# Patient Record
Sex: Male | Born: 1980 | Race: White | Hispanic: No
Health system: Midwestern US, Community
[De-identification: ages and names within clinical notes are randomized; demographics above are authoritative.]

---

## 2007-12-13 ENCOUNTER — Emergency Department: Payer: Self-pay | Admitting: Emergency Medicine

## 2008-06-17 ENCOUNTER — Emergency Department: Payer: Self-pay | Admitting: Emergency Medicine

## 2010-02-20 ENCOUNTER — Emergency Department: Payer: Self-pay | Admitting: Emergency Medicine
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
## 2012-07-02 ENCOUNTER — Emergency Department: Payer: Self-pay | Admitting: Emergency Medicine

## 2012-07-02 LAB — ETHANOL: Ethanol: <3 mg/dL

## 2012-07-02 LAB — CBC
HCT: 47 % (ref 40.0–52.0)
MCH: 28.8 pg (ref 26.0–34.0)
MCHC: 33.4 g/dL (ref 32.0–36.0)
MCV: 86 fL (ref 80–100)
Platelet: 286 10*3/uL (ref 150–440)
RDW: 12.6 % (ref 11.5–14.5)
WBC: 19.1 10*3/uL — ABNORMAL HIGH (ref 3.8–10.6)

## 2012-07-02 LAB — COMPREHENSIVE METABOLIC PANEL
Albumin: 4.7 g/dL (ref 3.4–5.0)
Alkaline Phosphatase: 86 U/L (ref 50–136)
BUN: 9 mg/dL (ref 7–18)
Bilirubin,Total: 1.3 mg/dL — ABNORMAL HIGH (ref 0.2–1.0)
Calcium, Total: 9.5 mg/dL (ref 8.5–10.1)
Co2: 25 mmol/L (ref 21–32)
Creatinine: 1.06 mg/dL (ref 0.60–1.30)
EGFR (African American): >60
Osmolality: 281 (ref 275–301)
Potassium: 3.3 mmol/L — ABNORMAL LOW (ref 3.5–5.1)
SGOT(AST): 21 U/L (ref 15–37)
Sodium: 141 mmol/L (ref 136–145)

## 2012-07-02 LAB — DRUG SCREEN, URINE
Barbiturates, Ur Screen: NEGATIVE (ref ?–200)
Cannabinoid 50 Ng, Ur ~~LOC~~: NEGATIVE (ref ?–50)
Cocaine Metabolite,Ur ~~LOC~~: NEGATIVE (ref ?–300)
MDMA (Ecstasy)Ur Screen: NEGATIVE (ref ?–500)
Methadone, Ur Screen: NEGATIVE (ref ?–300)
Opiate, Ur Screen: NEGATIVE (ref ?–300)

## 2012-07-02 LAB — URINALYSIS, COMPLETE
Bilirubin,UR: NEGATIVE
Leukocyte Esterase: NEGATIVE
Nitrite: NEGATIVE
Ph: 5 (ref 4.5–8.0)
Protein: 100
RBC,UR: NONE SEEN /HPF (ref 0–5)
Squamous Epithelial: NONE SEEN
WBC UR: 4 /HPF (ref 0–5)

## 2013-07-06 IMAGING — CR DG HUMERUS 2V *L*
1 series · 2 of 2 positions shown · non-contrast
Comparison: none

REASON FOR EXAM: pain
COMMENTS:

PROCEDURE:     DXR - DXR HUMERUS LEFT  - July 02, 2012  [DATE]
RESULT:     Comparison: None.

[Series 2: w humerus lat left · 0.14mm/px · 2 of 2 slices shown]
[im 1/2]
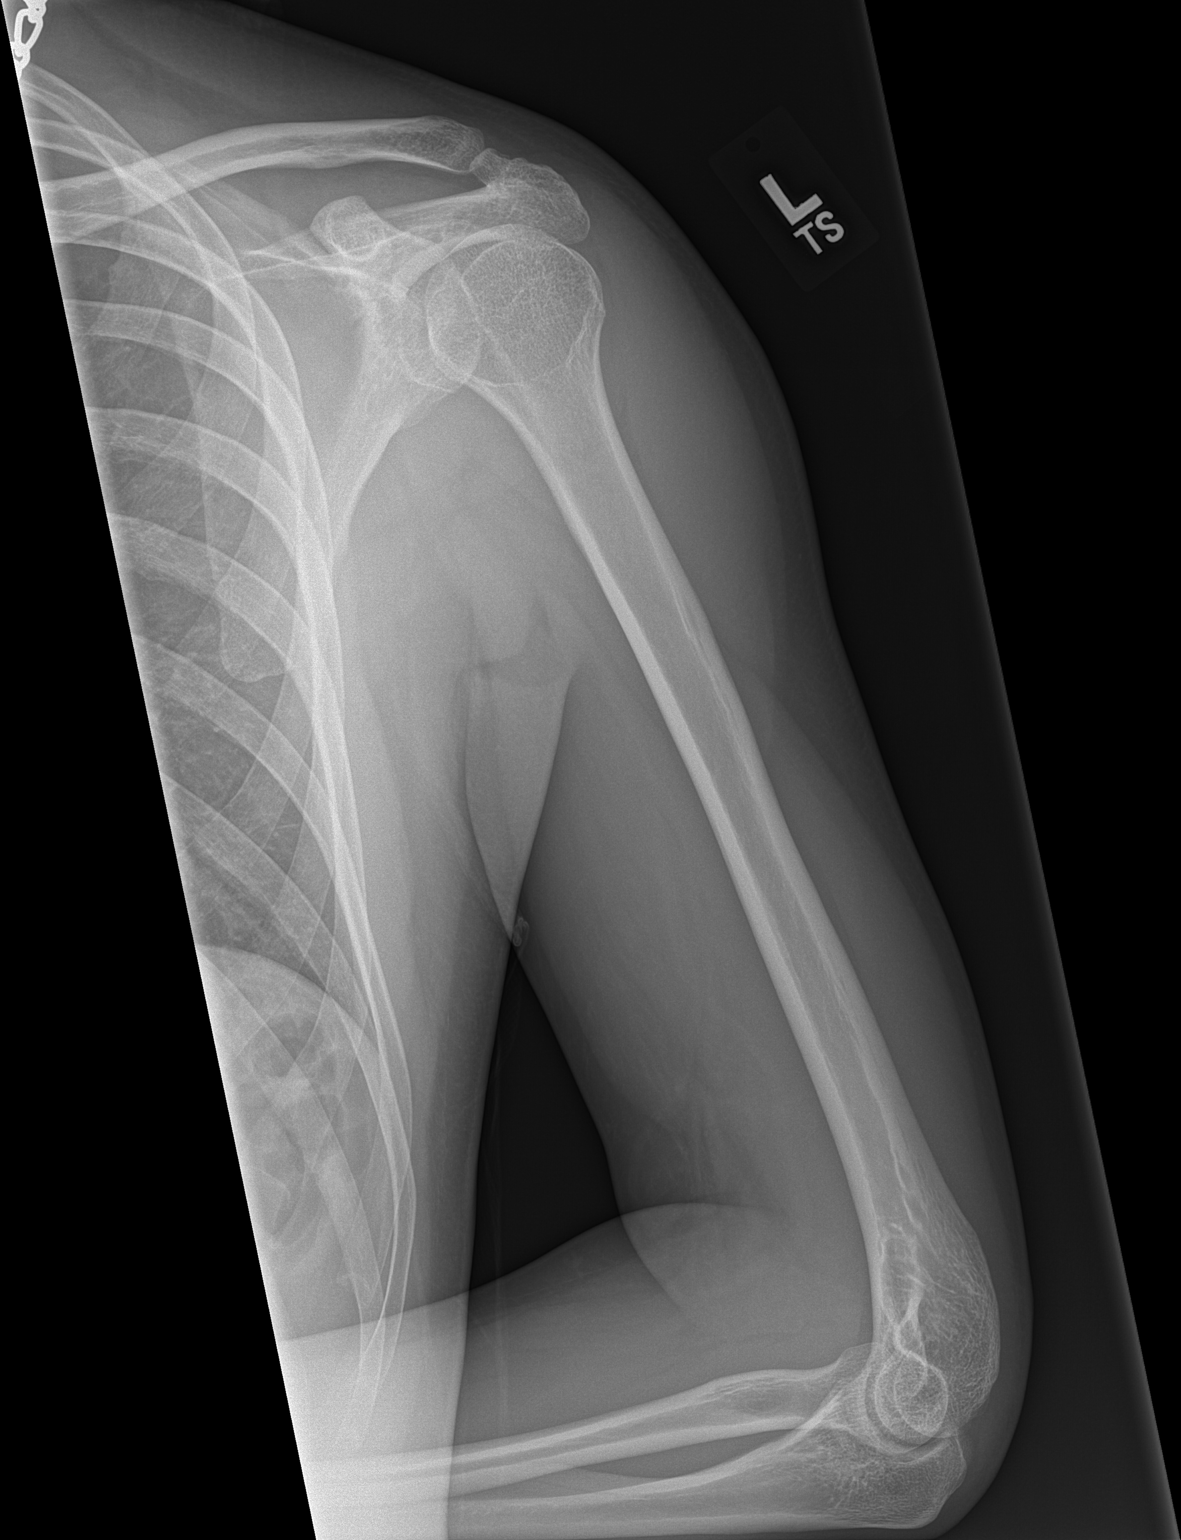
[im 2/2]
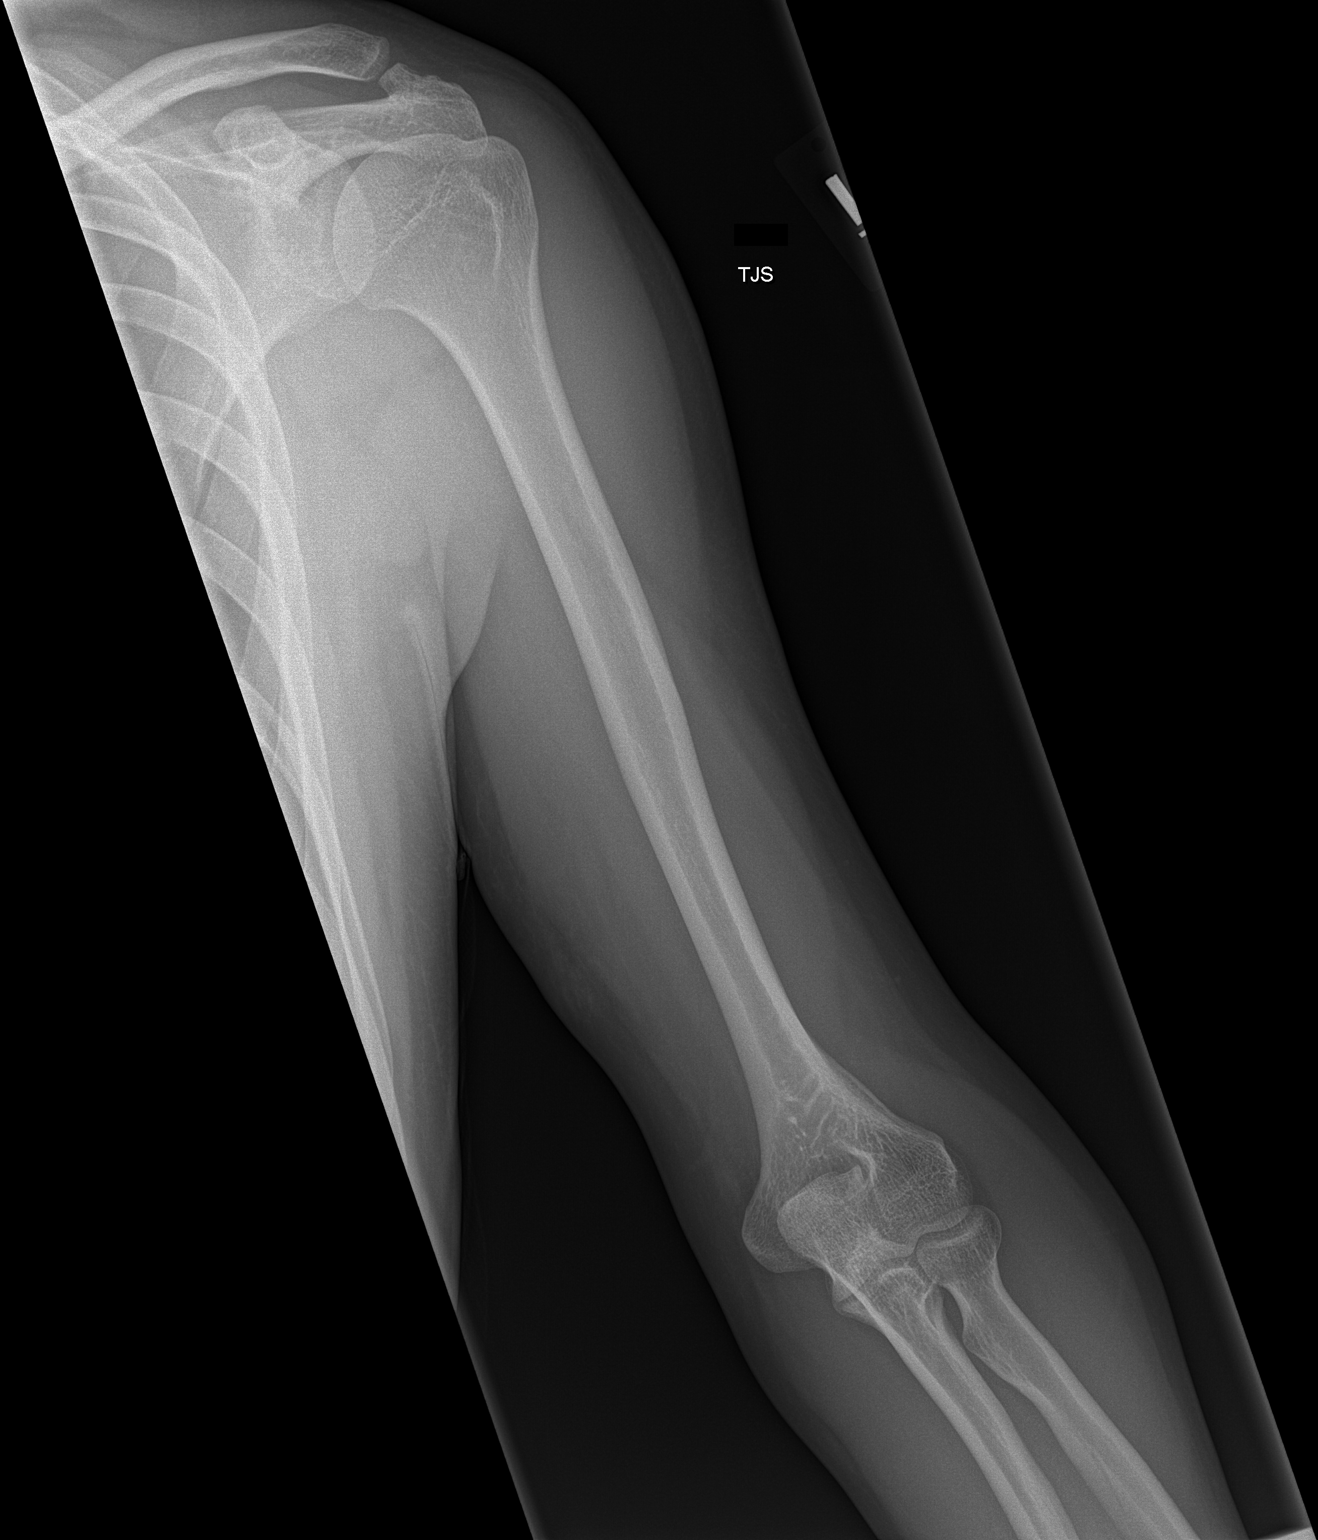

[2 of 2 positions shown; findings below may reference images not displayed]

FINDINGS: No acute fracture.
IMPRESSION: No acute fracture.

[REDACTED]
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
## 2019-08-25 ENCOUNTER — Ambulatory Visit
Admit: 2019-08-25 | Discharge: 2019-08-25 | Payer: PRIVATE HEALTH INSURANCE | Attending: Orthopaedic Surgery | Primary: Family Medicine

## 2019-08-25 DIAGNOSIS — R202 Paresthesia of skin: Secondary | ICD-10-CM

## 2019-08-25 DIAGNOSIS — R200 Anesthesia of skin: Secondary | ICD-10-CM

## 2019-08-25 NOTE — Patient Instructions (Addendum)
[email or web address]        Cubital tunnel night splint: [email or web address]  Price: $36.97        CUBITAL TUNNEL SPLINTING

## 2019-08-25 NOTE — Progress Notes (Signed)
[facility]  [facility]  [address]  [address]  [place] [address]  Dept: [phone]  Loc: [phone]    08/25/2019    Chief Complaint   Patient presents with   . New Patient     Left ulnar neuropathy       HISTORY     Anthony Howard is a 38 y.o. Right-handed male who presents today complaining of tingling and numbness in his left hand, particularly worse in the Middle Finger, Ring Finger and Small Finger that has been constant in nature for the past 2 month(s).     He reports nighttime awakening and frequently dropping things. Symptoms are worse with activities and alleviated with rest.  Discomfort currently rated as severe.     He admits to medial elbow discomfort.     Previous treatments include:    Night Splinting:  No  Injections:  No  EMG:  No  Surgery on Affected Extremity:  No    He states his symptoms are worse when he smoking cigarettes with his left hand and his elbow is in a flexed position.  He endorses medial elbow discomfort that radiates into his forearm and into his small and ring fingers primarily.  Occasionally he feels numbness into his middle finger.    Past Surgical History:   Procedure Laterality Date   . APPENDECTOMY  2013     No past medical history on file.  No family history on file.  Social History     Socioeconomic History   . Marital status: Unknown     Spouse name: Not on file   . Number of children: Not on file   . Years of education: Not on file   . Highest education level: Not on file   Occupational History   . Not on file   Social Needs   . Financial resource strain: Not on file   . Food insecurity     Worry: Not on file     Inability: Not on file   . Transportation needs     Medical: Not on file     Non-medical: Not on file   Tobacco Use   . Smoking status: Current Every Day Smoker     Packs/day: 0.25   . Smokeless tobacco: Never Used   Substance and Sexual Activity   . Alcohol use: Not on file   .  Drug use: Not on file   . Sexual activity: Not on file   Lifestyle   . Physical activity     Days per week: Not on file     Minutes per session: Not on file   . Stress: Not on file   Relationships   . Social Wellsite geologist on phone: Not on file     Gets together: Not on file     Attends religious service: Not on file     Active member of club or organization: Not on file     Attends meetings of clubs or organizations: Not on file     Relationship status: Not on file   . Intimate partner violence     Fear of current or ex partner: Not on file     Emotionally abused: Not on file     Physically abused: Not on file     Forced sexual activity: Not on file   Other Topics Concern   . Not on  file   Social History Narrative   . Not on file     No Known Allergies  No current outpatient medications on file.     No current facility-administered medications for this visit.        Review of Systems   Fevers/Chills: no   Numbness: YES         Weakness: YES        Immunocompromised:no   All other systems were reviewed and are negative    OBJECTIVE   BP (!) 146/88   Temp 98.2 F (36.8 C)   Ht 5\' 10"  (1.778 m)   Wt 172 lb (78 kg)   BMI 24.68 kg/m     Anthony Howard is a normal weight male in no acute distress. He appears his stated age and is alert and oriented X3 with a normal mood and affect.  He exibits nonlabored breathing with equal chest rise.  Gait and station are grossly normal.  Skin is warm and dry in bilateral upper and bilateral lower extremities with no significant lower extremity edema.    Ortho Exam    Comprehensive Exam of BILATERAL Upper Extremities   RIGHT LEFT   Atrophy not present not present   Tinel's at Wrist negative POSITIVE, Ulnar   Carpal Compression negative POSITIVE, Ulnar   Tinel's at Elbow negative POSITIVE   Flexion/Compression Elbow negative POSITIVE   Ulnar Nerve Subluxation POSITIVE POSITIVE   APB Strength 5/5 5/5   Intrinsic Strength 5/5 4/5     2-point discrimination (mm) RIGHT Hand  Thumb  Index Long Ring Small   r u r u r u r u r u   5  5  5  5  5  5  5 5 5 5    Median Ulnar     2-point discrimination (mm) LEFT Hand  Thumb Index Long Ring Small   r u r u r u r u r u   7  7  7  7  7  7  7 7 8 8    Median Ulnar     Palpable radial pulses bilaterally.  Full range of motion without evidence of instabilities in bilateral shoulders, elbows, wrists, or digits.    IMAGING     none    NCT/EMG (Copied Impression)     none    PROCEDURE     none    ASSESSMENT   1. Numbness and tingling in left hand  - [facility]) - Robb Matar, MD    PLAN      I discussed with Anthony Howard the etiology and natural history of ulnar neuropathy as well as the risks and benefits of both conservative and invasive interventions.  He decided to pursue NCT/EMG.      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  I would like to send him for a nerve conduction EMG of his left upper extremity and then I'll call him with the results thereafter.  He understands he will likely require surgical intervention secondary to his exam today.  In the meantime he was given information on how to night splint the elbow    Follow-up:  Anthony Howard will followup with me for EMG results.  He  knows to call the office with any questions or concerns in the interim.    Next Appointment Imaging:  None       Joylene John, MD  Hand and Upper Extremity  Surgery  [facility]  Department of Orthopaedics and Sports Medicine  08/25/2019 at 1:11 PM    (Please note that portions of this note may have been completed with a voice recognition program. Efforts were made to edit the dictations but occasionally words are mis-transcribed.)

## 2019-10-13 ENCOUNTER — Encounter: Attending: Orthopaedic Surgery | Primary: Family Medicine
